# Patient Record
Sex: Male | Born: 1985 | Race: White | Hispanic: No | Marital: Single | Smoking: Never smoker
Health system: Southern US, Community
[De-identification: ages and names within clinical notes are randomized; demographics above are authoritative.]

---

## 2016-12-15 ENCOUNTER — Emergency Department: Payer: BLUE CROSS/BLUE SHIELD

## 2016-12-15 ENCOUNTER — Encounter: Payer: Self-pay | Admitting: *Deleted

## 2016-12-15 ENCOUNTER — Emergency Department
Admission: EM | Admit: 2016-12-15 | Discharge: 2016-12-15 | Disposition: A | Discharge status: Home / Self Care | Payer: BLUE CROSS/BLUE SHIELD | Attending: Emergency Medicine | Admitting: Emergency Medicine

## 2016-12-15 DIAGNOSIS — S0990XA Unspecified injury of head, initial encounter: Secondary | ICD-10-CM | POA: Insufficient documentation

## 2016-12-15 DIAGNOSIS — Y929 Unspecified place or not applicable: Secondary | ICD-10-CM | POA: Insufficient documentation

## 2016-12-15 DIAGNOSIS — Y999 Unspecified external cause status: Secondary | ICD-10-CM | POA: Insufficient documentation

## 2016-12-15 DIAGNOSIS — W501XXA Accidental kick by another person, initial encounter: Secondary | ICD-10-CM | POA: Insufficient documentation

## 2016-12-15 DIAGNOSIS — Y9366 Activity, soccer: Secondary | ICD-10-CM | POA: Insufficient documentation

## 2016-12-15 NOTE — ED Provider Notes (Signed)
[facility] Emergency Department Provider Note   ____________________________________________   First MD Initiated Contact with Patient 12/15/16 [phone]     (approximate)  I have reviewed the triage vital signs and the nursing notes.   HISTORY  Chief Complaint Head Injury    HPI Edward Rubio is a 31 y.o. male patient complaining of left parietal headache secondary to blunt trauma. Patient states that kicked in the head playing soccer last night. Patient denies loss conscious but did not continue playing. Patient state he is increase but admits to no palliative measures except for applied ice to the  area.States headache is worse in the last night. Patient denies vision changes, vertigo, or nausea. Patient rates his pain as a 4/10. Patient had a pain as "achy/pressure".   History reviewed. No pertinent past medical history.  There are no active problems to display for this patient.   History reviewed. No pertinent surgical history.  Prior to Admission medications   Not on File    Allergies Patient has no known allergies.  No family history on file.  Social History Social History  Substance Use Topics  . Smoking status: Never Smoker  . Smokeless tobacco: Never Used  . Alcohol use No    Review of Systems  Constitutional: No fever/chills Eyes: No visual changes. ENT: No sore throat. Cardiovascular: Denies chest pain. Respiratory: Denies shortness of breath. Gastrointestinal: No abdominal pain.  No nausea, no vomiting.  No diarrhea.  No constipation. Genitourinary: Negative for dysuria. Musculoskeletal: Negative for back pain. Skin: Negative for rash. Neurological: Positive for headaches, but denies focal weakness or numbness.   ____________________________________________   PHYSICAL EXAM:  VITAL SIGNS: ED Triage Vitals  Enc Vitals Group     BP 12/15/16 0855 138/85     Pulse Rate 12/15/16 0855 66     Resp --      Temp 12/15/16  0855 98.9 F (37.2 C)     Temp Source 12/15/16 0855 Oral     SpO2 12/15/16 0855 99 %     Weight 12/15/16 0855 154 lb 5.2 oz (70 kg)     Height 12/15/16 0855  (1.727 m)     Head Circumference --      Peak Flow --      Pain Score 12/15/16 0900 4     Pain Loc --      Pain Edu? --      Excl. in GC? --    Constitutional: Alert and oriented. Well appearing and in no acute distress. Eyes: Conjunctivae are normal. PERRL. EOMI. Head: Atraumatic. Moderate guarding palpation left parietal area. Nose: No congestion/rhinnorhea. Mouth/Throat: Mucous membranes are moist.  Oropharynx non-erythematous. Neck: No stridor.  No cervical spine tenderness to palpation. Hematological/Lymphatic/Immunilogical: No cervical lymphadenopathy. Cardiovascular: Normal rate, regular rhythm. Grossly normal heart sounds.  Good peripheral circulation. Respiratory: Normal respiratory effort.  No retractions. Lungs CTAB. Neurologic:  Normal speech and language. No gross focal neurologic deficits are appreciated. No gait instability. Skin:  Skin is warm, dry and intact. No rash noted. Psychiatric: Mood and affect are normal. Speech and behavior are normal.  ____________________________________________   LABS (all labs ordered are listed, but only abnormal results are displayed)  Labs Reviewed - No data to display ____________________________________________  EKG   ____________________________________________  RADIOLOGY  Ct Head Wo Contrast  Result Date: 12/15/2016 CLINICAL DATA:  31 year old male status post blunt trauma, kicked in head playing soccer last night. Posttraumatic headache. Left side scalp erythema. EXAM: CT  HEAD WITHOUT CONTRAST TECHNIQUE: Contiguous axial images were obtained from the base of the skull through the vertex without intravenous contrast. COMPARISON:  None. FINDINGS: Brain: No midline shift, ventriculomegaly, mass effect, evidence of mass lesion, intracranial hemorrhage or evidence  of cortically based acute infarction. Gray-white matter differentiation is within normal limits throughout the brain. Partially empty sella. Vascular: No suspicious intracranial vascular hyperdensity. Skull: No skull fracture identified.  Negative. Sinuses/Orbits: Mild to moderate ethmoid sinus mucosal thickening. Other visible paranasal sinuses, tympanic cavities, and mastoids are clear. Other: Visualized orbit soft tissues are within normal limits. No scalp hematoma identified. IMPRESSION: 1.  No acute traumatic injury identified. 2. Normal noncontrast CT appearance of the brain aside from partially empty sella, which is often a normal anatomic variation but can be associated with idiopathic intracranial hypertension (pseudotumor cerebri). Electronically Signed   By: Odessa Fleming M.D.   On: 12/15/2016 10:22    ____No acute final CT of the head. ________________________________________   PROCEDURES  Procedure(s) performed: None  Procedures  Critical Care performed: No  ____________________________________________   INITIAL IMPRESSION / ASSESSMENT AND PLAN / ED COURSE  Pertinent labs & imaging results that were available during my care of the patient were reviewed by me and considered in my medical decision making (see chart for details).  Headache secondary to contusion. Discussed findings on CT of the head. Patient given discharge care instruction. Patient advised to take Tylenol as needed for headache/pain. Patient advised follow-up with the [facility] if condition persists.      ____________________________________________   FINAL CLINICAL IMPRESSION(S) / ED DIAGNOSES  Final diagnoses:  Minor head injury, initial encounter      NEW MEDICATIONS STARTED DURING THIS VISIT:  There are no discharge medications for this patient.    Note:  This document was prepared using Dragon voice recognition software and may include unintentional dictation errors.      Joni Reining, PA-C 12/15/16 1037    Sharman Cheek, MD 12/18/16 [phone]

## 2016-12-15 NOTE — Discharge Instructions (Signed)
Advised Tylenol for headache/pain.

## 2016-12-15 NOTE — ED Triage Notes (Signed)
States he was playing soccer last night and got kicked in the head, left side redness noted, denies any LOC, denies any vision changes, states headache

## 2016-12-15 NOTE — ED Notes (Signed)
See triage note  States he was playing soccer yesterday   Was hit in head by another player  No loc

## 2016-12-15 NOTE — ED Notes (Addendum)
Patient arrives via [facility] from [facility].  Kicked in head last PM by another soccer player.

## 2018-06-07 ENCOUNTER — Ambulatory Visit: Payer: BLUE CROSS/BLUE SHIELD | Admitting: Family Medicine

## 2018-12-19 IMAGING — CT CT HEAD W/O CM
3 series · 15 of 47 positions shown, 18 images · non-contrast
Comparison: None.

CLINICAL DATA: 31-year-old male status post blunt trauma, kicked in
head playing soccer last night. Posttraumatic headache. Left side
scalp erythema.

EXAM:
CT HEAD WITHOUT CONTRAST
TECHNIQUE: Contiguous axial images were obtained from the base of the skull
through the vertex without intravenous contrast.

[Series 2: head wo · axial · 0.44mm/px · z∈[+350,+475]mm · 9 of 30 slices shown, 12 images]
[im 3/30  brain]
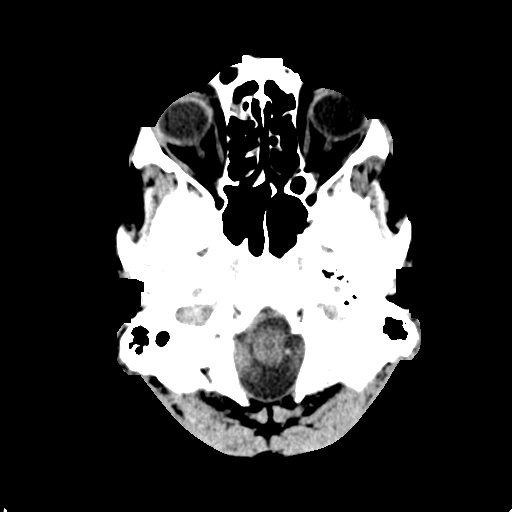
[im 3/30  bone]
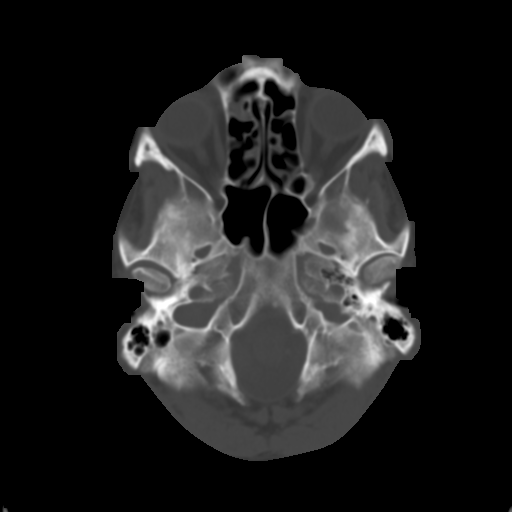
[im 6/30  brain]
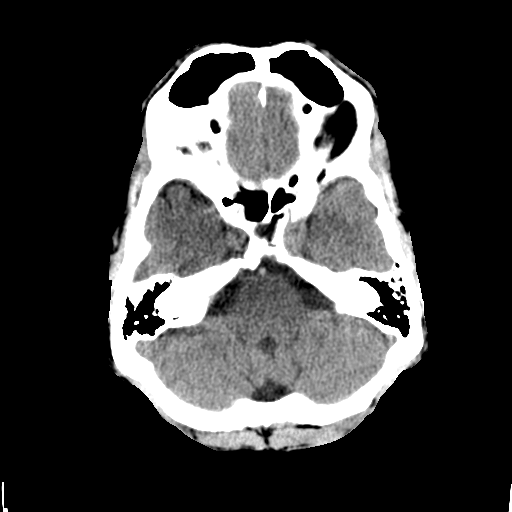
[im 9/30  brain]
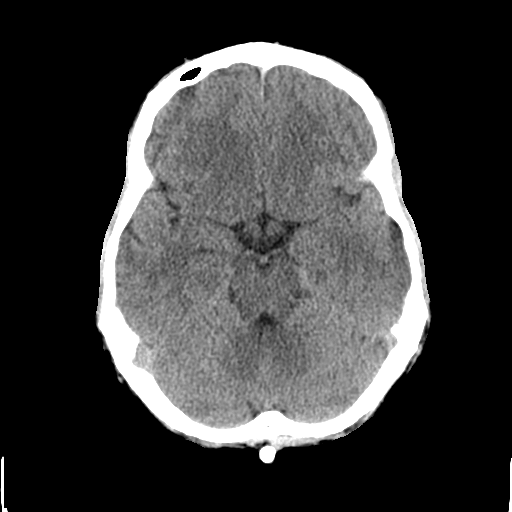
[im 12/30  brain]
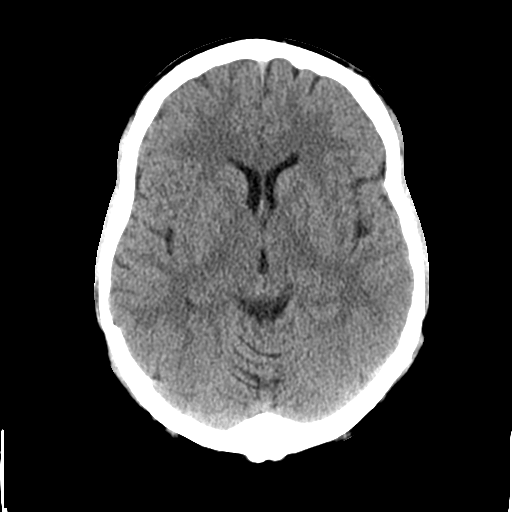
[im 16/30  brain]
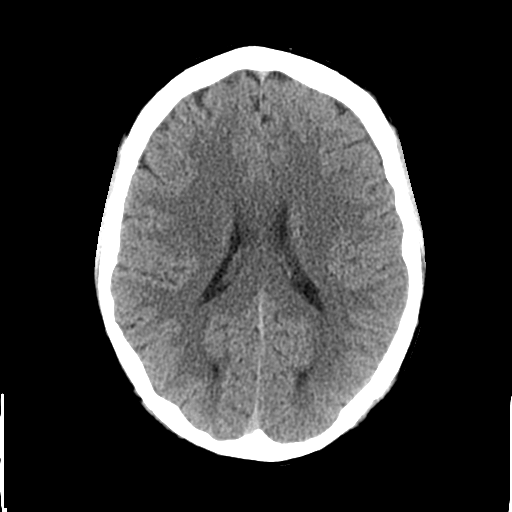
[im 16/30  bone]
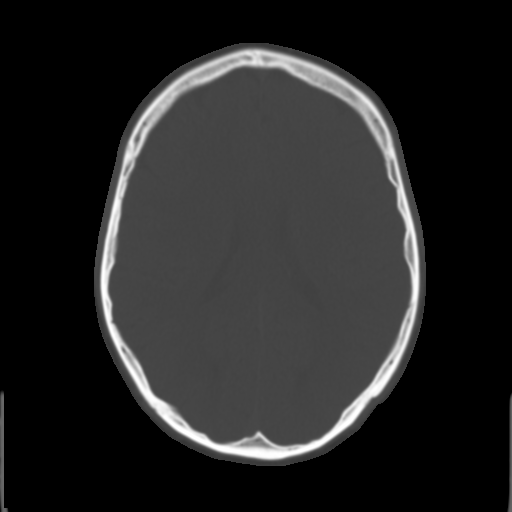
[im 19/30  brain]
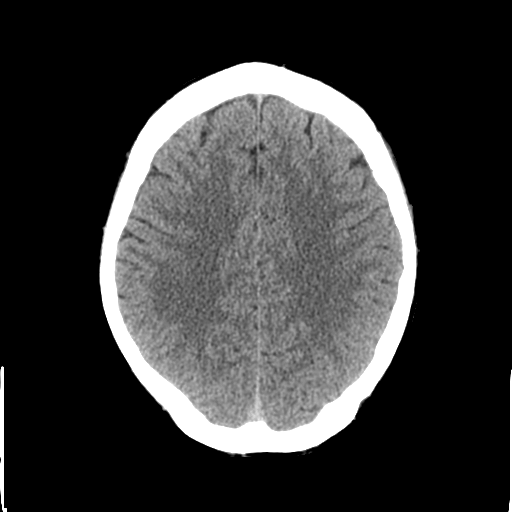
[im 22/30  brain]
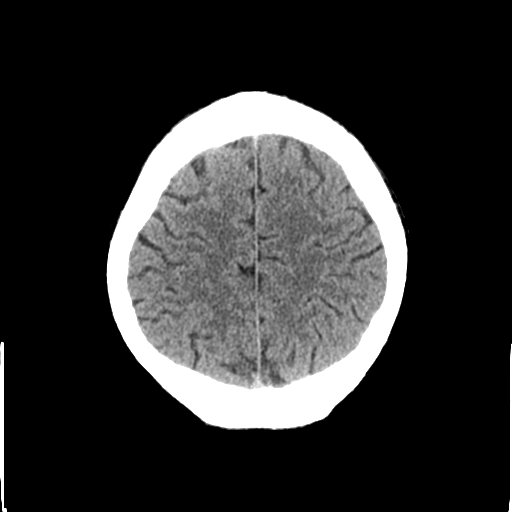
[im 25/30  brain]
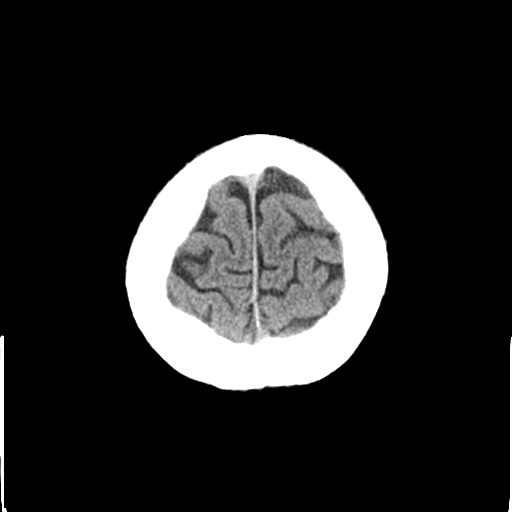
[im 28/30  brain]
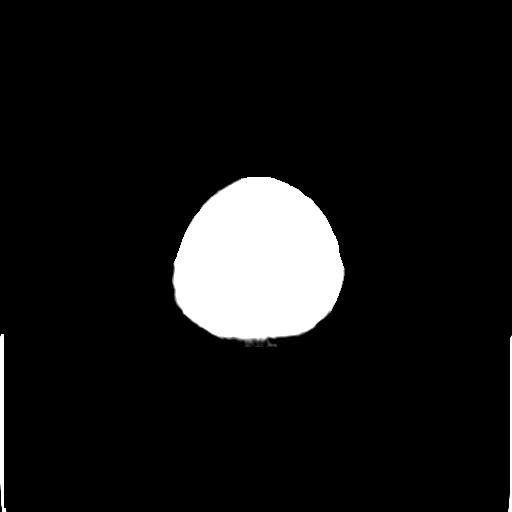
[im 28/30  bone]
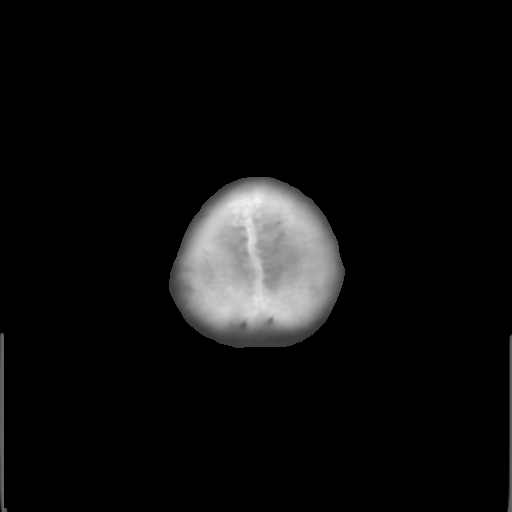

[Series 4: coronal soft tissue · coronal · 0.30mm/px · 3 of 70 slices shown]
[im 24/70  brain]
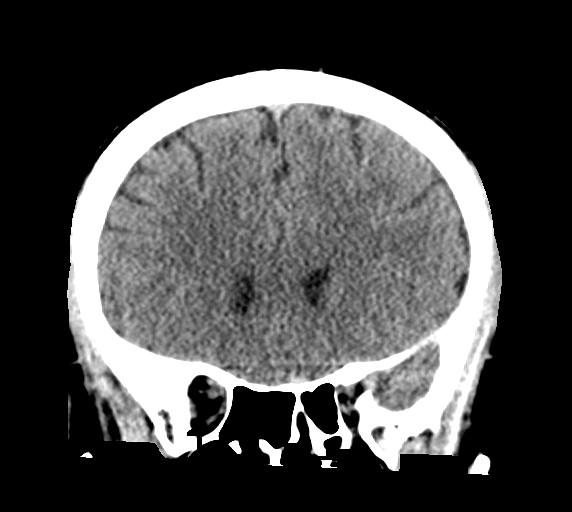
[im 31/70  brain]
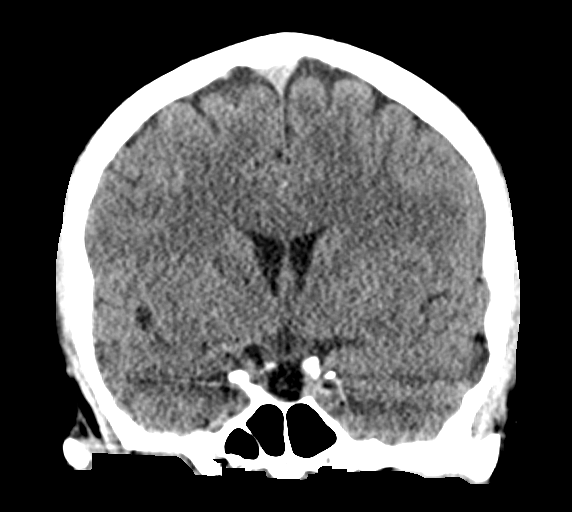
[im 39/70  brain]
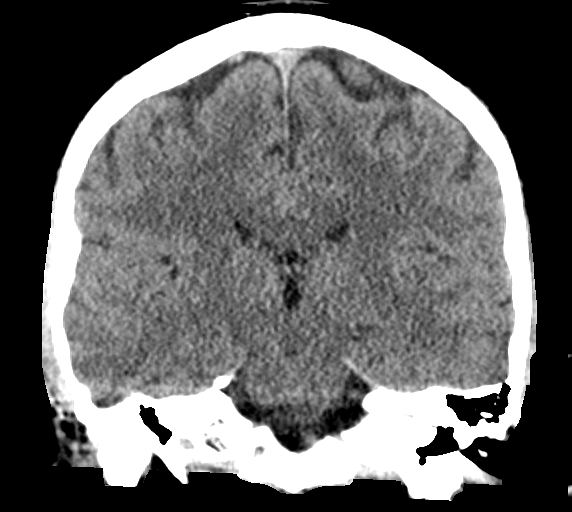

[Series 5: sagittal soft tissue · sagittal · 0.30mm/px · 3 of 53 slices shown]
[im 18/53  brain]
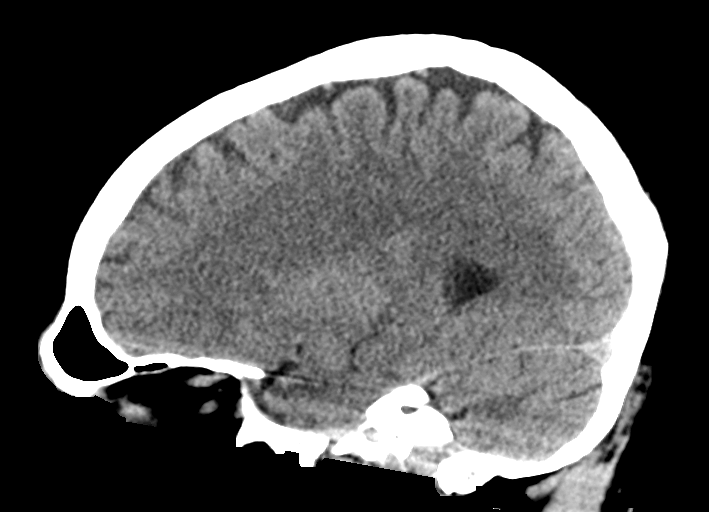
[im 27/53  brain]
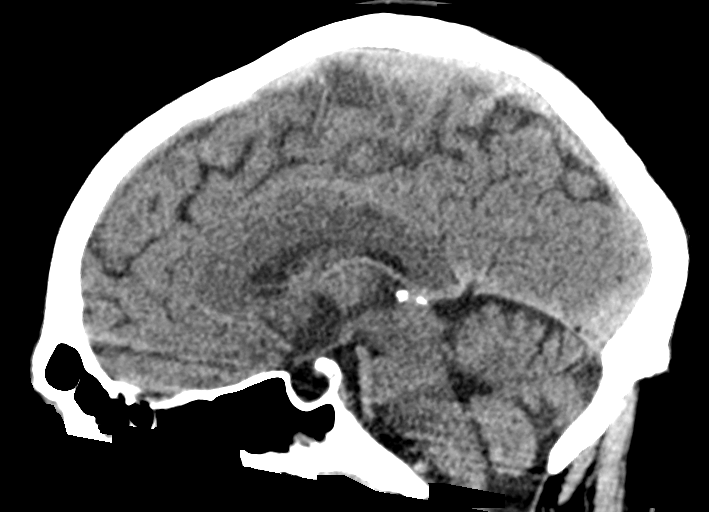
[im 35/53  brain]
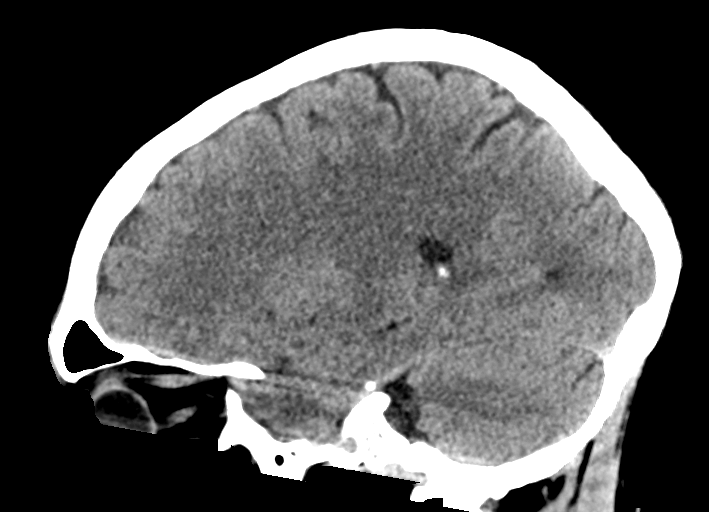

[15 of 47 positions shown; findings below may reference images not displayed]

FINDINGS: Brain: No midline shift, ventriculomegaly, mass effect, evidence of
mass lesion, intracranial hemorrhage or evidence of cortically based
acute infarction. Gray-white matter differentiation is within normal
limits throughout the brain. Partially empty sella.

Vascular: No suspicious intracranial vascular hyperdensity.

Skull: No skull fracture identified.  Negative.

Sinuses/Orbits: Mild to moderate ethmoid sinus mucosal thickening.
Other visible paranasal sinuses, tympanic cavities, and mastoids are
clear.

Other: Visualized orbit soft tissues are within normal limits. No
scalp hematoma identified.
IMPRESSION: 1.  No acute traumatic injury identified.
2. Normal noncontrast CT appearance of the brain aside from
partially empty sella, which is often a normal anatomic variation
but can be associated with idiopathic intracranial hypertension
(pseudotumor cerebri).

## 2019-05-28 ENCOUNTER — Ambulatory Visit: Payer: Self-pay | Attending: Family Medicine

## 2019-05-28 DIAGNOSIS — Z23 Encounter for immunization: Secondary | ICD-10-CM | POA: Insufficient documentation

## 2019-05-28 NOTE — Progress Notes (Signed)
   Covid-19 Vaccination Clinic  Name:  Edward Rubio    MRN: [id number] DOB: 03/31/1985  05/28/2019  Edward Rubio was observed post Covid-19 immunization for 15 minutes without incidence. He was provided with Vaccine Information Sheet and instruction to access the V-Safe system.   Edward Rubio was instructed to call 911 with any severe reactions post vaccine: Marland Kitchen Difficulty breathing  . Swelling of your face and throat  . A fast heartbeat  . A bad rash all over your body  . Dizziness and weakness    Immunizations Administered    Name Date Dose VIS Date Route   Pfizer COVID-19 Vaccine 05/28/2019  8:59 AM 0.3 mL 03/10/2019 Intramuscular   Manufacturer: [employer], [employer]   Lot: [id number]   NDC: [address]-9

## 2019-06-19 ENCOUNTER — Ambulatory Visit: Payer: Self-pay | Attending: Internal Medicine

## 2019-06-19 DIAGNOSIS — Z23 Encounter for immunization: Secondary | ICD-10-CM

## 2019-06-19 NOTE — Progress Notes (Signed)
   Covid-19 Vaccination Clinic  Name:  Dartanian Knaggs    MRN: [id number] DOB: Feb 11, 1986  06/19/2019  Mr. Lax was observed post Covid-19 immunization for 15 minutes without incident. He was provided with Vaccine Information Sheet and instruction to access the V-Safe system.   Mr. Oshita was instructed to call 911 with any severe reactions post vaccine: Marland Kitchen Difficulty breathing  . Swelling of face and throat  . A fast heartbeat  . A bad rash all over body  . Dizziness and weakness   Immunizations Administered    Name Date Dose VIS Date Route   Pfizer COVID-19 Vaccine 06/19/2019  8:50 AM 0.3 mL 03/10/2019 Intramuscular   Manufacturer: [employer], [employer]   Lot: [id number]   NDC: [address]-4
